# Patient Record
Sex: Male | Born: 2000 | Race: White | Hispanic: No | Marital: Single | Smoking: Never smoker
Health system: Southern US, Community
[De-identification: ages and names within clinical notes are randomized; demographics above are authoritative.]

## PROBLEM LIST (undated history)

## (undated) HISTORY — PX: TONSILLECTOMY: SUR1361

---
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
## 2018-09-12 DIAGNOSIS — Z68.41 Body mass index (BMI) pediatric, less than 5th percentile for age: Secondary | ICD-10-CM | POA: Diagnosis not present

## 2018-09-12 DIAGNOSIS — Z1389 Encounter for screening for other disorder: Secondary | ICD-10-CM | POA: Diagnosis not present

## 2018-09-12 DIAGNOSIS — N6341 Unspecified lump in right breast, subareolar: Secondary | ICD-10-CM | POA: Diagnosis not present

## 2018-09-16 ENCOUNTER — Other Ambulatory Visit (HOSPITAL_COMMUNITY): Payer: Self-pay | Admitting: Family Medicine

## 2018-09-16 DIAGNOSIS — N63 Unspecified lump in unspecified breast: Secondary | ICD-10-CM

## 2018-09-17 ENCOUNTER — Ambulatory Visit (HOSPITAL_COMMUNITY)
Admission: RE | Admit: 2018-09-17 | Discharge: 2018-09-17 | Disposition: A | Discharge status: Home / Self Care | Payer: Self-pay | Source: Ambulatory Visit | Attending: Family Medicine | Admitting: Family Medicine

## 2018-09-17 DIAGNOSIS — N6341 Unspecified lump in right breast, subareolar: Secondary | ICD-10-CM | POA: Diagnosis not present

## 2018-09-17 DIAGNOSIS — N63 Unspecified lump in unspecified breast: Secondary | ICD-10-CM | POA: Insufficient documentation

## 2019-02-07 DIAGNOSIS — Z68.41 Body mass index (BMI) pediatric, less than 5th percentile for age: Secondary | ICD-10-CM | POA: Diagnosis not present

## 2019-02-07 DIAGNOSIS — Z0000 Encounter for general adult medical examination without abnormal findings: Secondary | ICD-10-CM | POA: Diagnosis not present

## 2019-02-07 DIAGNOSIS — Z1389 Encounter for screening for other disorder: Secondary | ICD-10-CM | POA: Diagnosis not present

## 2020-11-10 IMAGING — US US BREAST*R* LIMITED INC AXILLA
1 series · 9 of 9 positions shown · non-contrast
Comparison: None.

CLINICAL DATA: 17-year-old male with right retroareolar palpable
abnormality with associated tenderness.

EXAM:
ULTRASOUND OF THE RIGHT BREAST

[Series 1: us breast*right* limited inc axilla · 0.07mm/px · 9 of 9 slices shown]
[im 1/9]
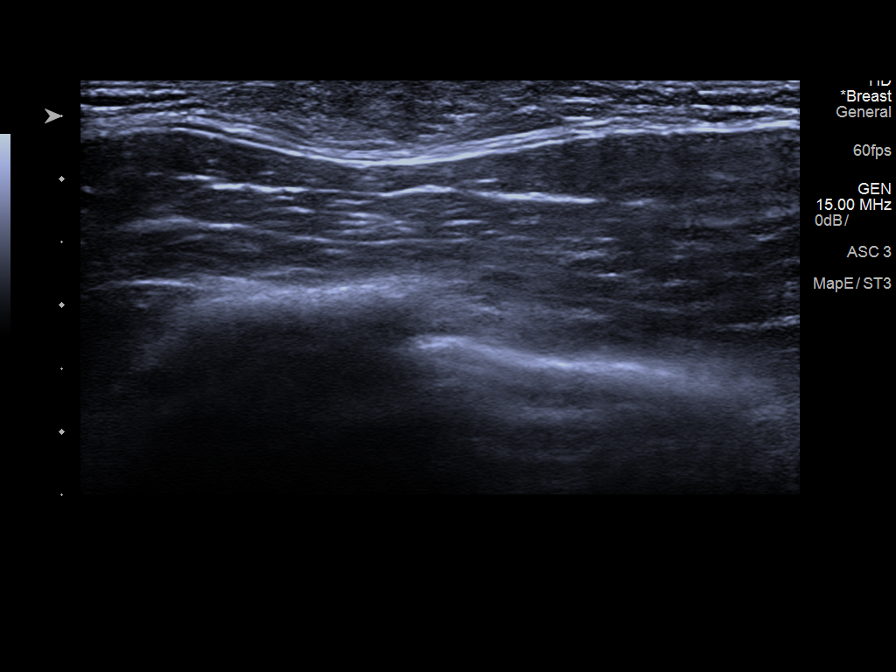
[im 2/9]
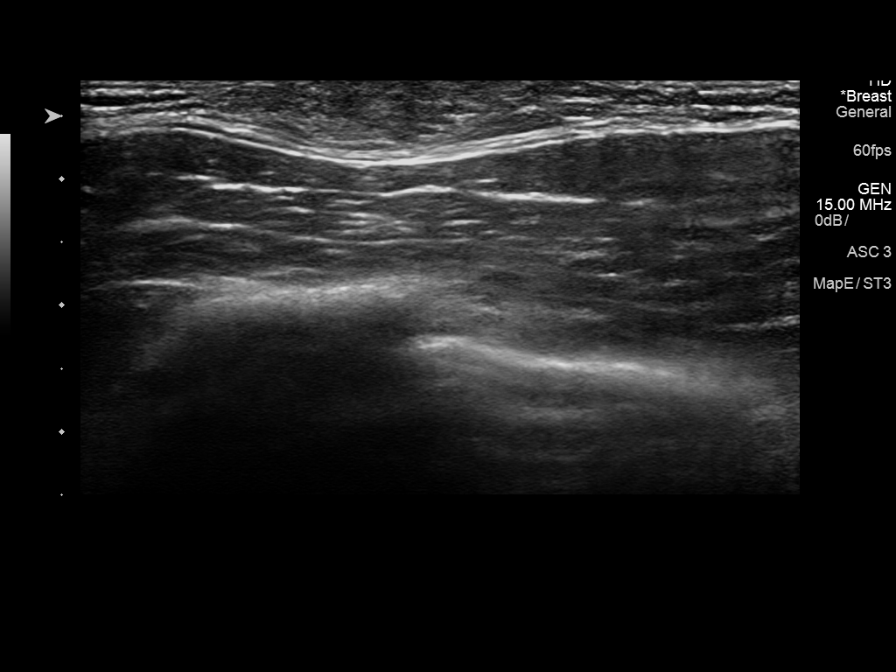
[im 3/9]
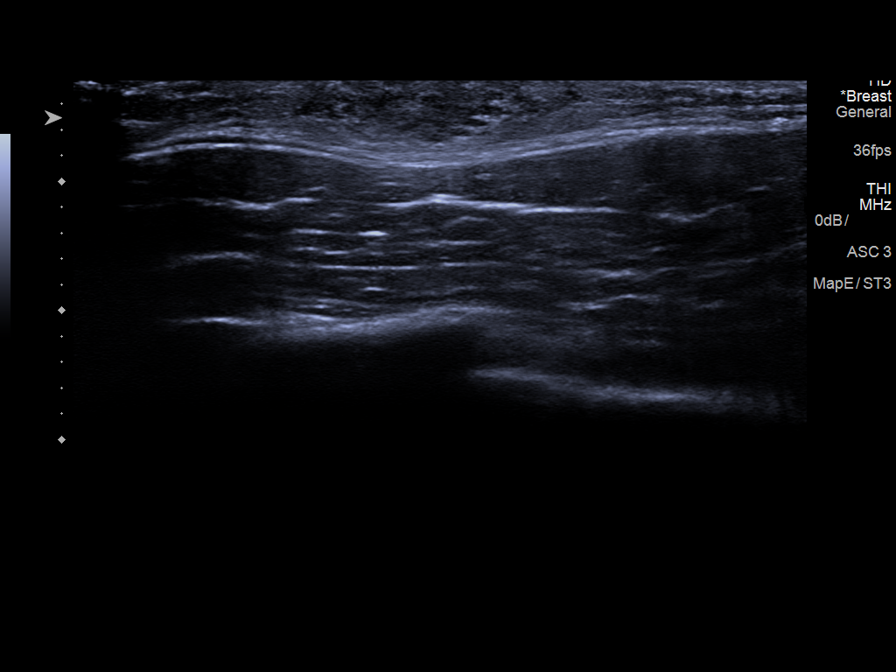
[im 4/9]
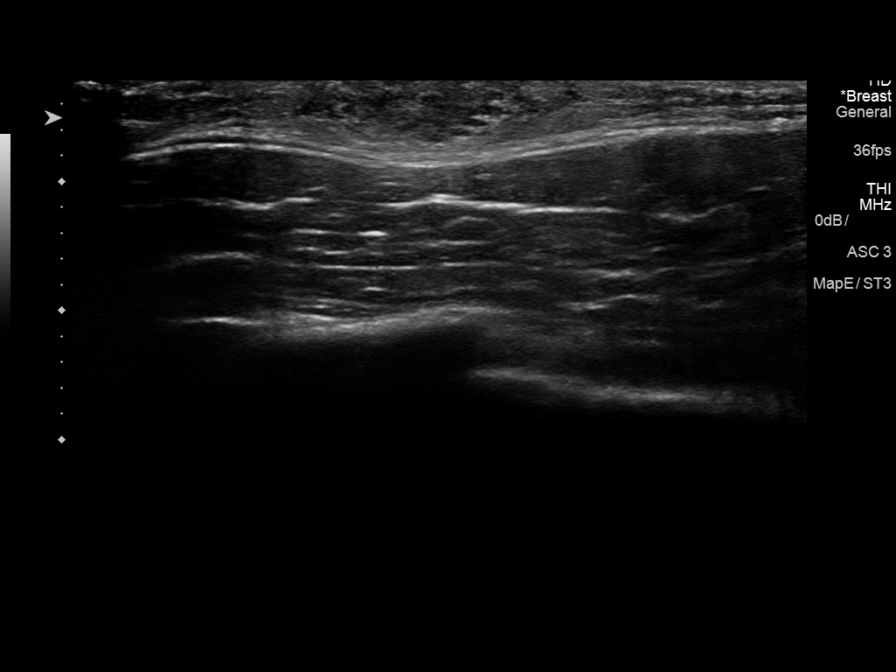
[im 5/9]
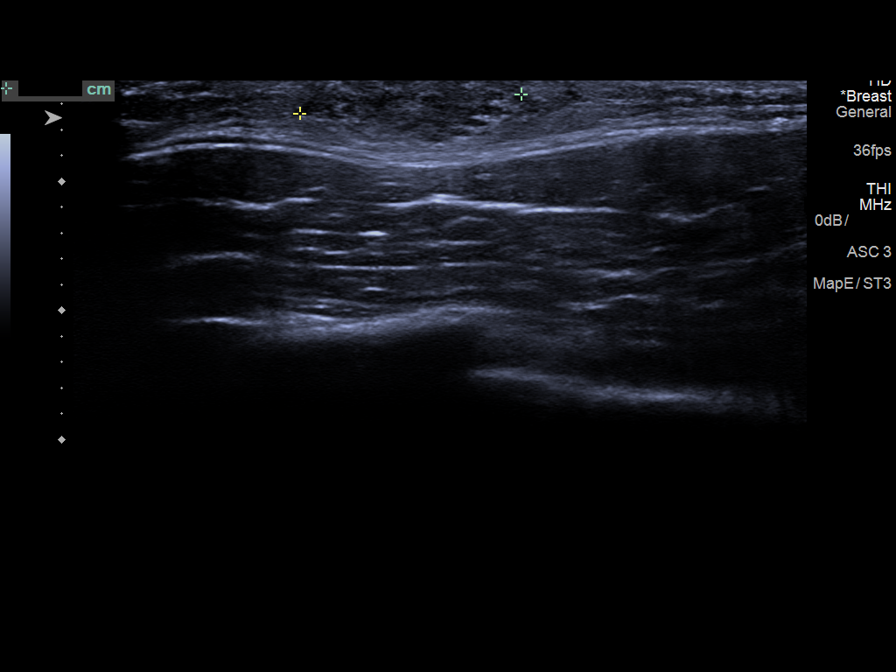
[im 6/9]
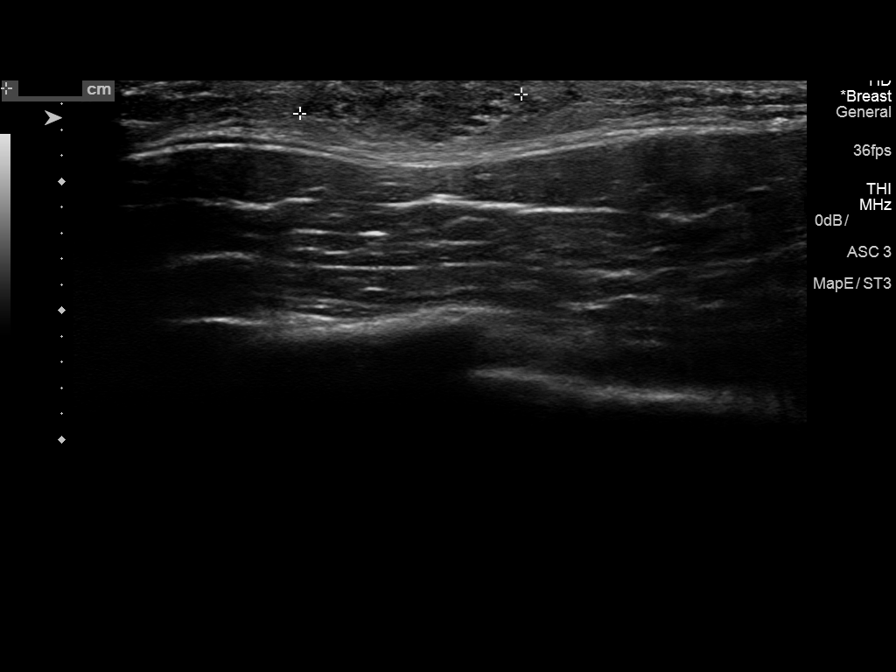
[im 7/9]
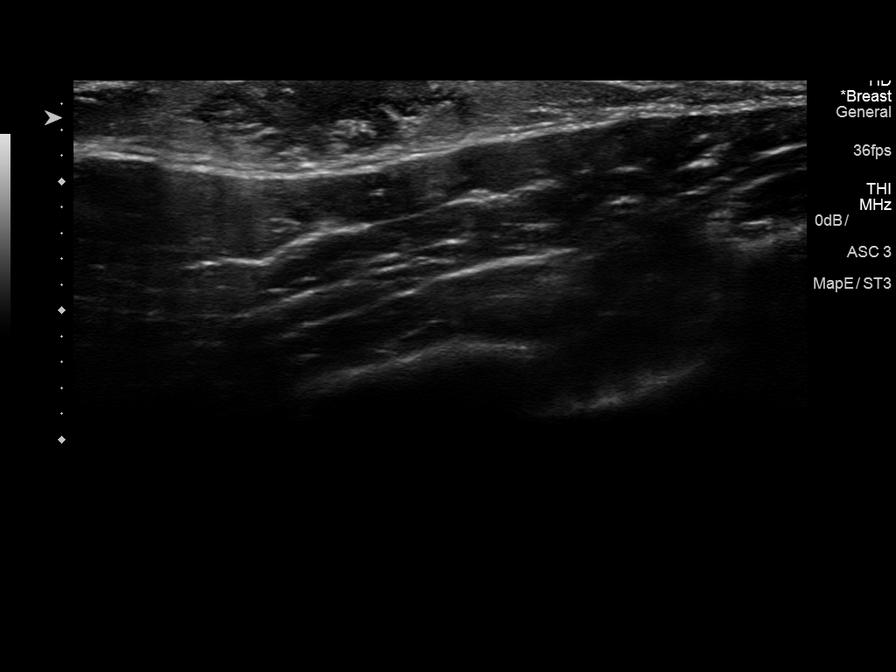
[im 8/9]
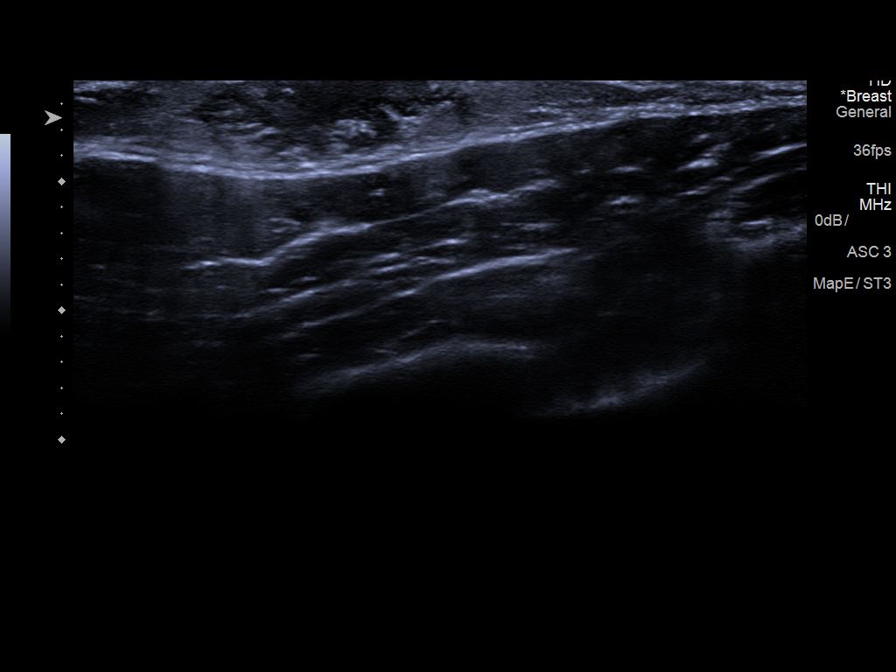
[im 9/9]
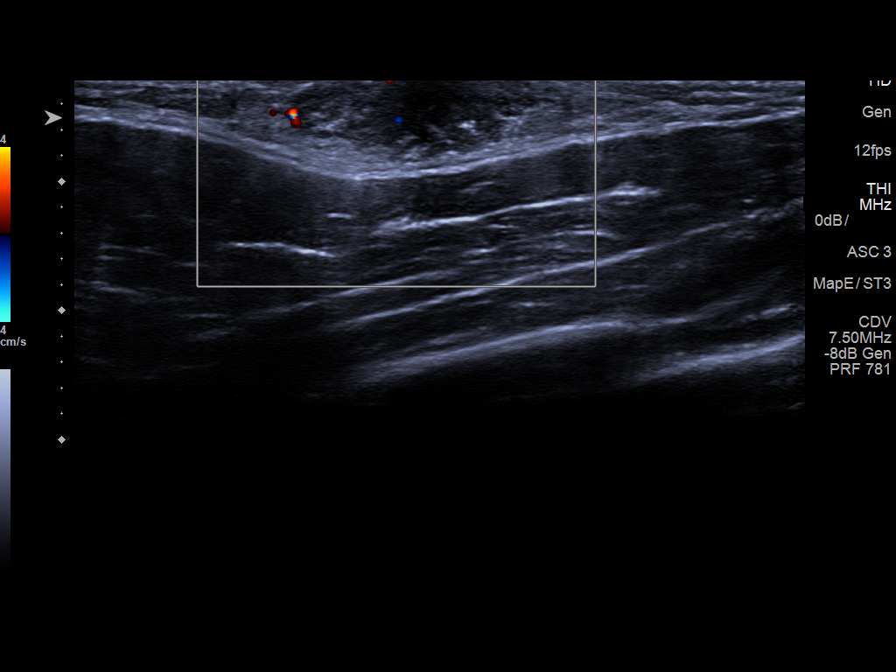

[9 of 9 positions shown; findings below may reference images not displayed]

FINDINGS: Physical examination at site of concern reveals a soft mobile
quarter sized mass behind the right nipple.

Targeted ultrasound of the retroareolar right breast was performed
demonstrating a hypoechoic/flame shaped areas subjacent to the
nipple most compatible with benign gynecomastia.
IMPRESSION: Gynecomastia, accounting for the right retroareolar palpable
abnormality with associated tenderness.

RECOMMENDATION:
Clinical follow-up is needed.

I have discussed the findings and recommendations with the patient.
Results were also provided in writing at the conclusion of the
visit. If applicable, a reminder letter will be sent to the patient
regarding the next appointment.

BI-RADS CATEGORY  2: Benign.
